# Patient Record
Sex: Male | Born: 1995 | Race: White | Hispanic: No | Marital: Single | Smoking: Never smoker
Health system: Southern US, Community
[De-identification: ages and names within clinical notes are randomized; demographics above are authoritative.]

---
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
## 2021-04-21 ENCOUNTER — Other Ambulatory Visit: Payer: Self-pay

## 2021-04-21 ENCOUNTER — Encounter: Payer: Self-pay | Admitting: Family Medicine

## 2021-04-21 ENCOUNTER — Ambulatory Visit: Payer: Self-pay | Admitting: Family Medicine

## 2021-04-21 DIAGNOSIS — A749 Chlamydial infection, unspecified: Secondary | ICD-10-CM

## 2021-04-21 DIAGNOSIS — Z113 Encounter for screening for infections with a predominantly sexual mode of transmission: Secondary | ICD-10-CM

## 2021-04-21 LAB — GRAM STAIN

## 2021-04-21 MED ORDER — DOXYCYCLINE HYCLATE 100 MG PO TABS: 100.0000 mg | ORAL_TABLET | Freq: Two times a day (BID) | ORAL | 0 refills | Status: AC

## 2021-04-21 NOTE — Progress Notes (Signed)
Pt here for STD screening.  Gram stain results reviewed and medication dispensed.  Pt declined condoms.  Berdie Ogren, RN

## 2021-04-24 NOTE — Progress Notes (Signed)
[facility] Department STI clinic/screening visit  Subjective:  Thomas Lucero is a 26 y.o. male being seen today for an STI screening visit. The patient reports they do have symptoms.    Patient has the following medical conditions:  There are no problems to display for this patient.    Chief Complaint  Patient presents with   SEXUALLY TRANSMITTED DISEASE    Screening and tx    HPI  Patient reports here for screening, has s/sx   Does the patient or their partner desires a pregnancy in the next year? No  Screening for MPX risk: Does the patient have an unexplained rash? No Is the patient MSM? No Does the patient endorse multiple sex partners or anonymous sex partners? Yes Did the patient have close or sexual contact with a person diagnosed with MPX? No Has the patient traveled outside the [place] where MPX is endemic? No Is there a high clinical suspicion for MPX-- evidenced by one of the following No  -Unlikely to be chickenpox  -Lymphadenopathy  -Rash that present in same phase of evolution on any given body part   See flowsheet for further details and programmatic requirements.    The following portions of the patient's history were reviewed and updated as appropriate: allergies, current medications, past medical history, past social history, past surgical history and problem list.  Objective:  There were no vitals filed for this visit.  Physical Exam Constitutional:      Appearance: Normal appearance.  HENT:     Head: Normocephalic.     Mouth/Throat:     Mouth: Mucous membranes are moist.     Pharynx: Oropharynx is clear. No oropharyngeal exudate.  Pulmonary:     Effort: Pulmonary effort is normal.  Genitourinary:    Penis: Normal.      Testes: Normal.     Comments: No lice, nits, or pest, no lesions or odor discharge.  Denies pain or tenderness with paplation of testicles.  No lesions, ulcers or masses present.    Musculoskeletal:     Cervical back:  Normal range of motion.  Lymphadenopathy:     Cervical: No cervical adenopathy.  Skin:    General: Skin is warm and dry.     Findings: No bruising, erythema, lesion or rash.  Neurological:     Mental Status: He is alert and oriented to person, place, and time.  Psychiatric:        Mood and Affect: Mood normal.        Behavior: Behavior normal.      Assessment and Plan:  Thomas Lucero is a 26 y.o. male presenting to the [facility] Department for STI screening  1. Screening examination for venereal disease Patient does have STI symptoms Patient accepted all screenings including  gram stain, urethral GC and declines bloodwork for HIV/RPR.  Patient meets criteria for HepB screening? No. Ordered? No - does not meet criteria  Patient meets criteria for HepC screening? No. Ordered? No - does not meet criteria.  Recommended condom use with all sex Discussed importance of condom use for STI prevent  Treat gram stain per standing order Discussed time line for [facility] results and that patient will be called with positive results and encouraged patient to call if he had not heard in 2 weeks Recommended returning for continued or worsening symptoms.   - Gonococcus culture - Gram stain  2. Chlamydia Pt tested + for Chlamydia with online testing, was given Azithromycin  by online provider, but pt still has s/sx.    - doxycycline (VIBRA-TABS) 100 MG tablet; Take 1 tablet (100 mg total) by mouth 2 (two) times daily for 7 days.  Dispense: 14 tablet; Refill: 0     No follow-ups on file.  No future appointments.  Junious Dresser, FNP

## 2021-04-26 LAB — GONOCOCCUS CULTURE

## 2022-02-07 ENCOUNTER — Ambulatory Visit: Payer: Self-pay | Admitting: Family Medicine

## 2022-04-10 ENCOUNTER — Ambulatory Visit: Payer: Self-pay | Admitting: Family Medicine

## 2022-04-10 ENCOUNTER — Encounter: Payer: Self-pay | Admitting: Family Medicine

## 2022-04-10 DIAGNOSIS — Z113 Encounter for screening for infections with a predominantly sexual mode of transmission: Secondary | ICD-10-CM

## 2022-04-10 NOTE — Progress Notes (Signed)
[facility] Department STI clinic/screening visit  Subjective:  Thomas Lucero is a 27 y.o. male being seen today for an STI screening visit. The patient reports they do not have symptoms.    Patient has the following medical conditions:  There are no problems to display for this patient.    Chief Complaint  Patient presents with   SEXUALLY TRANSMITTED DISEASE    Reports some greenish/yellowish discharge for 2 days about 3 weeks ago. None since.     HPI  Patient reports to clinic for STI testing. States that 3 weeks ago he had 2 days of yellow discharge from his penis.  Last HIV test per patient/review of record was No results found for: "HMHIVSCREEN" No results found for: "HIV"  Does the patient or their partner desires a pregnancy in the next year? No  Screening for MPX risk: Does the patient have an unexplained rash? No Is the patient MSM? No Does the patient endorse multiple sex partners or anonymous sex partners? No Did the patient have close or sexual contact with a person diagnosed with MPX? No Has the patient traveled outside the [place] where MPX is endemic? No Is there a high clinical suspicion for MPX-- evidenced by one of the following No  -Unlikely to be chickenpox  -Lymphadenopathy  -Rash that present in same phase of evolution on any given body part   See flowsheet for further details and programmatic requirements.   Immunization History  Administered Date(s) Administered   Hepatitis B, PED/ADOLESCENT 1995-05-25, 09/02/1996   Tdap 12/08/2008     The following portions of the patient's history were reviewed and updated as appropriate: allergies, current medications, past medical history, past social history, past surgical history and problem list.  Objective:  There were no vitals filed for this visit.  Physical Exam Vitals and nursing note reviewed.  Constitutional:      Appearance: Normal appearance.  HENT:     Head: Normocephalic and  atraumatic.     Mouth/Throat:     Mouth: Mucous membranes are moist.     Pharynx: No oropharyngeal exudate or posterior oropharyngeal erythema.  Eyes:     General:        Right eye: No discharge.        Left eye: No discharge.     Conjunctiva/sclera:     Right eye: Right conjunctiva is not injected. No exudate.    Left eye: Left conjunctiva is not injected. No exudate. Pulmonary:     Effort: Pulmonary effort is normal.  Abdominal:     General: Abdomen is flat.     Palpations: Abdomen is soft. There is no hepatomegaly or mass.     Tenderness: There is no abdominal tenderness. There is no rebound.  Genitourinary:    Comments: Declined genital exam Lymphadenopathy:     Cervical: No cervical adenopathy.     Upper Body:     Right upper body: No supraclavicular or axillary adenopathy.     Left upper body: No supraclavicular or axillary adenopathy.  Skin:    General: Skin is warm and dry.  Neurological:     Mental Status: He is alert and oriented to person, place, and time.       Assessment and Plan:  Thomas Lucero is a 27 y.o. male presenting to the [facility] Department for STI screening  1. Screening for venereal disease Declined gram stain today, declined blood work and genital exam.  - Chlamydia/GC NAA, Confirmation  Patient does not have STI symptoms Patient accepted all screenings including   Patient meets criteria for HepB screening? No. Ordered? not applicable Patient meets criteria for HepC screening? No. Ordered? not applicable Recommended condom use with all sex Discussed importance of condom use for STI prevent  Treat per standing order Discussed time line for State Lab results and that patient will be called with positive results and encouraged patient to call if he had not heard in 2 weeks Recommended returning for continued or worsening symptoms.   Return if symptoms worsen or fail to improve.  No future appointments.  Sharlet Salina,  [place]

## 2022-04-10 NOTE — Progress Notes (Signed)
Pt appointment for STI screening. Seen by FNP Lowella Petties. No initial results to review.

## 2022-04-13 LAB — CHLAMYDIA/GC NAA, CONFIRMATION
Chlamydia trachomatis, NAA: NEGATIVE
Neisseria gonorrhoeae, NAA: NEGATIVE

## 2022-11-06 ENCOUNTER — Ambulatory Visit: Payer: Self-pay | Admitting: Family Medicine

## 2022-11-06 DIAGNOSIS — Z202 Contact with and (suspected) exposure to infections with a predominantly sexual mode of transmission: Secondary | ICD-10-CM

## 2022-11-06 DIAGNOSIS — Z113 Encounter for screening for infections with a predominantly sexual mode of transmission: Secondary | ICD-10-CM

## 2022-11-06 DIAGNOSIS — Z708 Other sex counseling: Secondary | ICD-10-CM

## 2022-11-06 MED ORDER — CEFTRIAXONE SODIUM 500 MG IJ SOLR
500.0000 mg | Freq: Once | INTRAMUSCULAR | Status: AC
  Administered 2022-11-06: 500 mg via INTRAMUSCULAR

## 2022-11-06 NOTE — Progress Notes (Signed)
Monitored for 10 minutes post injection, no s/s of reaction noted.  Condoms given. Gaspar Garbe, RN

## 2022-11-06 NOTE — Progress Notes (Signed)
[facility] Department STI clinic/screening visit  Subjective:  Thomas Lucero is a 27 y.o. male being seen today for an [facility] screening visit in [facility] The patient reports they do not have symptoms.    Patient has the following medical conditions:  There are no problems to display for this patient.   Chief Complaint  Patient presents with   SEXUALLY TRANSMITTED DISEASE    Last HIV test per patient/review of record was No results found for: "HMHIVSCREEN" No results found for: "HIV"  Does the patient or their partner desires a pregnancy in the next year? No  Screening for MPX risk: Does the patient have an unexplained rash? No Is the patient MSM? No Does the patient endorse multiple sex partners or anonymous sex partners? No Did the patient have close or sexual contact with a person diagnosed with MPX? No Has the patient traveled outside the [place] where MPX is endemic? No Is there a high clinical suspicion for MPX-- evidenced by one of the following No  -Unlikely to be chickenpox  -Lymphadenopathy  -Rash that present in same phase of evolution on any given body part   See flowsheet for further details and programmatic requirements.   Immunization History  Administered Date(s) Administered   Hepatitis B, PED/ADOLESCENT 10-05-1996, 09/02/1996   Tdap 12/08/2008     The following portions of the patient's history were reviewed and updated as appropriate: allergies, current medications, past medical history, past social history, past surgical history and problem list.  Objective:  There were no vitals filed for this visit.  Patient seen by RN only. Self collected samples.  Assessment and Plan:  Thomas Lucero is a 27 y.o. male presenting to the [facility] Department for STI screening in [facility].  1. Screening for venereal disease  - Chlamydia/GC NAA, Confirmation  2. Counseling on sexually transmitted disease   3. Gonorrhea  contact    Patient  have STI symptoms Patient accepted all scdoes notreenings including  urine GC/Chlamydia, and blood work for HIV/Syphilis. Patient meets criteria for HepB screening? No. Ordered? not applicable Patient meets criteria for HepC screening? No. Ordered? not applicable Recommended condom use with all sex Discussed importance of condom use for STI prevent  Treat positive test results per standing order. Discussed time line for [facility] Lab results and that patient will be called with positive results and encouraged patient to call if he had not heard in 2 weeks Recommended repeat testing in 3 months with positive results.  No follow-ups on file.  Future Appointments  Date Time Provider Department Center  11/06/2022  2:20 PM [place], [facility], FNP [facility] None    Gaspar Garbe, RN

## 2022-11-06 NOTE — Progress Notes (Signed)
Attestation of [facility]: Evaluation and management procedures were performed by the Registered Nurse under [facility] standing orders.  RN independently saw the patient and provided screenings for them without medical exam. Patient self collected specimens. I have reviewed the RN's note and chart, and I agree with screening ordered.   Helena Middleton, FNP  

## 2022-11-11 LAB — CHLAMYDIA/GC NAA, CONFIRMATION
Chlamydia trachomatis, NAA: NEGATIVE
Neisseria gonorrhoeae, NAA: NEGATIVE

## 2022-11-13 ENCOUNTER — Telehealth: Payer: Self-pay | Admitting: Family Medicine

## 2022-11-13 NOTE — Telephone Encounter (Signed)
Please give me a call back I would like to receive my test results and for a nurse to go over them with me
